# Patient Record
Sex: Female | Born: 1950 | Race: White | Hispanic: No | Smoking: Current some day smoker
Health system: Southern US, Community
[De-identification: ages and names within clinical notes are randomized; demographics above are authoritative.]

## PROBLEM LIST (undated history)

## (undated) DIAGNOSIS — I10 Essential (primary) hypertension: Secondary | ICD-10-CM

## (undated) DIAGNOSIS — M329 Systemic lupus erythematosus, unspecified: Secondary | ICD-10-CM

## (undated) DIAGNOSIS — H269 Unspecified cataract: Secondary | ICD-10-CM

## (undated) DIAGNOSIS — M1990 Unspecified osteoarthritis, unspecified site: Secondary | ICD-10-CM

## (undated) DIAGNOSIS — R51 Headache: Secondary | ICD-10-CM

## (undated) DIAGNOSIS — Z87440 Personal history of urinary (tract) infections: Secondary | ICD-10-CM

## (undated) DIAGNOSIS — K219 Gastro-esophageal reflux disease without esophagitis: Secondary | ICD-10-CM

## (undated) DIAGNOSIS — G4730 Sleep apnea, unspecified: Secondary | ICD-10-CM

## (undated) DIAGNOSIS — R21 Rash and other nonspecific skin eruption: Secondary | ICD-10-CM

## (undated) DIAGNOSIS — R011 Cardiac murmur, unspecified: Secondary | ICD-10-CM

## (undated) DIAGNOSIS — Z950 Presence of cardiac pacemaker: Secondary | ICD-10-CM

## (undated) DIAGNOSIS — R202 Paresthesia of skin: Secondary | ICD-10-CM

## (undated) DIAGNOSIS — R200 Anesthesia of skin: Secondary | ICD-10-CM

## (undated) DIAGNOSIS — E785 Hyperlipidemia, unspecified: Secondary | ICD-10-CM

## (undated) DIAGNOSIS — I495 Sick sinus syndrome: Secondary | ICD-10-CM

## (undated) HISTORY — PX: KNEE ARTHROPLASTY: SHX992

## (undated) HISTORY — PX: ANTERIOR FUSION CERVICAL SPINE: SUR626

## (undated) HISTORY — PX: POSTERIOR FUSION CERVICAL SPINE: SUR628

## (undated) HISTORY — PX: CARDIAC CATHETERIZATION: SHX172

## (undated) HISTORY — PX: ABDOMINAL HYSTERECTOMY: SHX81

## (undated) HISTORY — PX: KIDNEY SURGERY: SHX687

## (undated) HISTORY — PX: CHOLECYSTECTOMY: SHX55

---

## 2008-02-29 ENCOUNTER — Emergency Department: Payer: Self-pay

## 2009-10-07 ENCOUNTER — Ambulatory Visit: Payer: Self-pay | Admitting: Internal Medicine

## 2011-12-13 ENCOUNTER — Other Ambulatory Visit: Payer: Self-pay | Admitting: Neurosurgery

## 2011-12-13 ENCOUNTER — Ambulatory Visit
Admission: RE | Admit: 2011-12-13 | Discharge: 2011-12-13 | Disposition: A | Discharge status: Home / Self Care | Payer: Medicare Other | Source: Ambulatory Visit | Attending: Neurosurgery | Admitting: Neurosurgery

## 2011-12-13 DIAGNOSIS — M79605 Pain in left leg: Secondary | ICD-10-CM

## 2011-12-23 ENCOUNTER — Other Ambulatory Visit: Payer: Self-pay | Admitting: Neurosurgery

## 2012-01-14 NOTE — Pre-Procedure Instructions (Addendum)
Patricia Christensen  01/14/2012   Your procedure is scheduled on:  Friday, November 8th.  Report to [facility] at 7:30 AM.  Call this number if you have problems the morning of surgery: [phone]   Remember:   Do not eat food or drink any liquid:After Midnight.     Take these medicines the morning of surgery with A SIP OF WATER: Omeprazole, Atenolol, Gabapentin.  May take Oxycodone- Acetaminophen if needed.   Do not wear jewelry, make-up or nail polish.  Do not wear lotions, powders, or perfumes. You may wear deodorant.  Do not shave 48 hours prior to surgery. Men may shave face and neck.  Do not bring valuables to the hospital.  Contacts, dentures or bridgework may not be worn into surgery.  Leave suitcase in the car. After surgery it may be brought to your room.  For patients admitted to the hospital, checkout time is 11:00 AM the day of discharge.   Patients discharged the day of surgery will not be allowed to drive home.  Name and phone number of your driver:  NA  Special Instructions: Shower using CHG 2 nights before surgery and the night before surgery.  If you shower the day of surgery use CHG.  Use special wash - you have one bottle of CHG for all showers.  You should use approximately 1/3 of the bottle for each shower. N/A   Please read over the following fact sheets that you were given: Pain Booklet, Coughing and Deep Breathing and Surgical Site Infection Prevention

## 2012-01-17 ENCOUNTER — Encounter (HOSPITAL_COMMUNITY)
Admission: RE | Admit: 2012-01-17 | Discharge: 2012-01-17 | Disposition: A | Discharge status: Home / Self Care | Payer: Medicare Other | Source: Ambulatory Visit | Attending: Neurosurgery | Admitting: Neurosurgery

## 2012-01-17 ENCOUNTER — Encounter (HOSPITAL_COMMUNITY): Payer: Self-pay

## 2012-01-17 HISTORY — DX: Systemic lupus erythematosus, unspecified: M32.9

## 2012-01-17 HISTORY — DX: Sleep apnea, unspecified: G47.30

## 2012-01-17 HISTORY — DX: Anesthesia of skin: R20.0

## 2012-01-17 HISTORY — DX: Unspecified cataract: H26.9

## 2012-01-17 HISTORY — DX: Headache: R51

## 2012-01-17 HISTORY — DX: Gastro-esophageal reflux disease without esophagitis: K21.9

## 2012-01-17 HISTORY — DX: Personal history of urinary (tract) infections: Z87.440

## 2012-01-17 HISTORY — DX: Anesthesia of skin: R20.2

## 2012-01-17 HISTORY — DX: Sick sinus syndrome: I49.5

## 2012-01-17 HISTORY — DX: Essential (primary) hypertension: I10

## 2012-01-17 HISTORY — DX: Unspecified osteoarthritis, unspecified site: M19.90

## 2012-01-17 HISTORY — DX: Cardiac murmur, unspecified: R01.1

## 2012-01-17 HISTORY — DX: Rash and other nonspecific skin eruption: R21

## 2012-01-17 HISTORY — DX: Presence of cardiac pacemaker: Z95.0

## 2012-01-17 HISTORY — DX: Hyperlipidemia, unspecified: E78.5

## 2012-01-17 LAB — CBC
MCV: 94.2 fL (ref 78.0–100.0)
Platelets: 179 10*3/uL (ref 150–400)
RBC: 5.17 MIL/uL — ABNORMAL HIGH (ref 3.87–5.11)
WBC: 9.8 10*3/uL (ref 4.0–10.5)

## 2012-01-17 LAB — BASIC METABOLIC PANEL
CO2: 24 mEq/L (ref 19–32)
Calcium: 9.4 mg/dL (ref 8.4–10.5)
Chloride: 105 mEq/L (ref 96–112)
GFR calc Af Amer: 72 mL/min — ABNORMAL LOW (ref >90)
Sodium: 139 mEq/L (ref 135–145)

## 2012-01-17 LAB — SURGICAL PCR SCREEN
MRSA, PCR: NEGATIVE
Staphylococcus aureus: NEGATIVE

## 2012-01-17 NOTE — Progress Notes (Signed)
I faxed a request to Dr Christell Constant in [facility], requesting most recent office notes, EKG, Pacemaker Perioperative Orders, Stress Test, and 2 D echo.

## 2012-01-18 NOTE — Consult Note (Addendum)
Anesthesia Chart Review:  Patient is a 62 year old female scheduled for left L3-4 laminectomy and diskectomy by Dr. Franky Macho on 01/21/12.  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    Cardiologist is Dr. Gerrianne Scale at [facility].  Last visit was 10/21/11 with one year follow-up recommended.  He did order a stress and echo to be done next year prior to her next appointment (I spoke with Dr. Kathi Der nurse [place] who reported these were ordered as routine testing.)  Alena also reported that the last carotid duplex available at their office was from 2008 and showed no significant ICA stenosis.  Last pacemaker evaluation was 12/15/11.  (Cath, echo, and stress reports are listed in his 10/21/11 note.)    EKG report on 02/09/11 showed atrial paced rhythm, consider LA abnormality.  2-D echocardiogram on 09/25/08 showed normal left ventricular size and function, mild left atrial enlargement, upper normal pulmonary pressures.  Cardiac cath on 10/31/08 showed the left ventricular systolic function is excellent with an LVEF of 65% and no focal wall motion abnormality. The LVEDP is 20. The left main is free of disease. The LAD is large in the proximal portion and tapers to more moderate size at the apex. A moderate-sized D1 is present, at the origin is a 25% abnormality. D2 is small without disease. The circumflex artery is dominant. A moderate-sized OM1 and larger OM 3 and PDA are present without disease. The right coronary artery is a rather small nondominant vessel. There is a 50% lesion at the ostium and catheter-induced back spasm of that site is noted.  Nuclear stress test on 06/09/2006 was normal without evidence of ischemia.  CXR on 01/17/12 showed heart size and vascular pattern are normal. Lungs are clear. Two lead pacer identified with  generator over the right hemithorax.  CBC and BMET noted.  She has a history of a PPM, but non-obstructive CAD.  She saw her Cardiologist within the past three months and by notes appeared stable.  I reviewed above with Anesthesiologist Dr. Katrinka Blazing.  If no significant change in her status then anticipate she can proceed as planned.   Shonna Chock, PA-C 01/19/12 1558

## 2012-01-20 MED ORDER — CEFAZOLIN SODIUM-DEXTROSE 2-3 GM-% IV SOLR
2.0000 g | INTRAVENOUS | Status: AC
  Administered 2012-01-21: 2 g via INTRAVENOUS
  Filled 2012-01-20: qty 50

## 2012-01-20 NOTE — Progress Notes (Signed)
SPOKE WITH TAMARA AT DR MOORE'S OFFICE, REFAXED PPM ORDER FORM TO DR MOORE'S OFFICE TO BE FILLED OUT AND SIGNED.

## 2012-01-21 ENCOUNTER — Encounter (HOSPITAL_COMMUNITY)
Admission: RE | Disposition: A | Discharge status: Home / Self Care | Payer: Self-pay | Source: Ambulatory Visit | Attending: Neurosurgery

## 2012-01-21 ENCOUNTER — Ambulatory Visit (HOSPITAL_COMMUNITY): Payer: Medicare Other

## 2012-01-21 ENCOUNTER — Encounter (HOSPITAL_COMMUNITY): Payer: Self-pay | Admitting: *Deleted

## 2012-01-21 ENCOUNTER — Inpatient Hospital Stay (HOSPITAL_COMMUNITY)
Admission: RE | Admit: 2012-01-21 | Discharge: 2012-01-21 | DRG: 491 | Disposition: A | Discharge status: Home / Self Care | Payer: Medicare Other | Source: Ambulatory Visit | Attending: Neurosurgery | Admitting: Neurosurgery

## 2012-01-21 ENCOUNTER — Ambulatory Visit (HOSPITAL_COMMUNITY): Payer: Medicare Other | Admitting: Vascular Surgery

## 2012-01-21 ENCOUNTER — Encounter (HOSPITAL_COMMUNITY): Payer: Self-pay | Admitting: Vascular Surgery

## 2012-01-21 DIAGNOSIS — Z6836 Body mass index (BMI) 36.0-36.9, adult: Secondary | ICD-10-CM

## 2012-01-21 DIAGNOSIS — Z91041 Radiographic dye allergy status: Secondary | ICD-10-CM

## 2012-01-21 DIAGNOSIS — Z888 Allergy status to other drugs, medicaments and biological substances status: Secondary | ICD-10-CM

## 2012-01-21 DIAGNOSIS — F17200 Nicotine dependence, unspecified, uncomplicated: Secondary | ICD-10-CM | POA: Diagnosis present

## 2012-01-21 DIAGNOSIS — K219 Gastro-esophageal reflux disease without esophagitis: Secondary | ICD-10-CM | POA: Diagnosis present

## 2012-01-21 DIAGNOSIS — R4182 Altered mental status, unspecified: Secondary | ICD-10-CM | POA: Diagnosis present

## 2012-01-21 DIAGNOSIS — Z79899 Other long term (current) drug therapy: Secondary | ICD-10-CM

## 2012-01-21 DIAGNOSIS — Z96659 Presence of unspecified artificial knee joint: Secondary | ICD-10-CM

## 2012-01-21 DIAGNOSIS — M1990 Unspecified osteoarthritis, unspecified site: Secondary | ICD-10-CM | POA: Diagnosis present

## 2012-01-21 DIAGNOSIS — M5126 Other intervertebral disc displacement, lumbar region: Principal | ICD-10-CM | POA: Diagnosis present

## 2012-01-21 DIAGNOSIS — Z886 Allergy status to analgesic agent status: Secondary | ICD-10-CM

## 2012-01-21 DIAGNOSIS — I10 Essential (primary) hypertension: Secondary | ICD-10-CM | POA: Diagnosis present

## 2012-01-21 DIAGNOSIS — M329 Systemic lupus erythematosus, unspecified: Secondary | ICD-10-CM | POA: Diagnosis present

## 2012-01-21 DIAGNOSIS — Z950 Presence of cardiac pacemaker: Secondary | ICD-10-CM

## 2012-01-21 DIAGNOSIS — Z885 Allergy status to narcotic agent status: Secondary | ICD-10-CM

## 2012-01-21 HISTORY — PX: LUMBAR LAMINECTOMY/DECOMPRESSION MICRODISCECTOMY: SHX5026

## 2012-01-21 SURGERY — LUMBAR LAMINECTOMY/DECOMPRESSION MICRODISCECTOMY 1 LEVEL
Anesthesia: General | Site: Spine Lumbar | Laterality: Left | Wound class: Clean

## 2012-01-21 MED ORDER — GABAPENTIN 300 MG PO CAPS
300.0000 mg | ORAL_CAPSULE | Freq: Three times a day (TID) | ORAL | Status: DC
  Filled 2012-01-21 (×2): qty 1

## 2012-01-21 MED ORDER — ATENOLOL 50 MG PO TABS
50.0000 mg | ORAL_TABLET | Freq: Every day | ORAL | Status: DC
  Filled 2012-01-21: qty 1

## 2012-01-21 MED ORDER — SODIUM CHLORIDE 0.9 % IJ SOLN: 3.0000 mL | INTRAMUSCULAR | Status: DC | PRN

## 2012-01-21 MED ORDER — MIDAZOLAM HCL 5 MG/5ML IJ SOLN
INTRAMUSCULAR | Status: DC | PRN
  Administered 2012-01-21: 2 mg via INTRAVENOUS

## 2012-01-21 MED ORDER — MORPHINE SULFATE 2 MG/ML IJ SOLN
1.0000 mg | INTRAMUSCULAR | Status: DC | PRN
  Administered 2012-01-21: 2 mg via INTRAVENOUS
  Filled 2012-01-21: qty 1

## 2012-01-21 MED ORDER — ROCURONIUM BROMIDE 100 MG/10ML IV SOLN
INTRAVENOUS | Status: DC | PRN
  Administered 2012-01-21: 10 mg via INTRAVENOUS
  Administered 2012-01-21: 50 mg via INTRAVENOUS

## 2012-01-21 MED ORDER — ACETAMINOPHEN 10 MG/ML IV SOLN
1000.0000 mg | Freq: Four times a day (QID) | INTRAVENOUS | Status: DC
  Administered 2012-01-21: 1000 mg via INTRAVENOUS
  Filled 2012-01-21 (×3): qty 100

## 2012-01-21 MED ORDER — MORPHINE SULFATE 4 MG/ML IJ SOLN
INTRAMUSCULAR | Status: AC
  Administered 2012-01-21: 2 mg
  Filled 2012-01-21: qty 1

## 2012-01-21 MED ORDER — POTASSIUM CHLORIDE IN NACL 20-0.9 MEQ/L-% IV SOLN
INTRAVENOUS | Status: DC
  Filled 2012-01-21 (×3): qty 1000

## 2012-01-21 MED ORDER — FENTANYL CITRATE 0.05 MG/ML IJ SOLN
INTRAMUSCULAR | Status: AC
  Filled 2012-01-21: qty 2

## 2012-01-21 MED ORDER — ACETAMINOPHEN 325 MG PO TABS: 650.0000 mg | ORAL_TABLET | ORAL | Status: DC | PRN

## 2012-01-21 MED ORDER — MORPHINE SULFATE 2 MG/ML IJ SOLN
1.0000 mg | INTRAMUSCULAR | Status: DC | PRN
  Administered 2012-01-21: 2 mg via INTRAVENOUS

## 2012-01-21 MED ORDER — NITROGLYCERIN 0.4 MG SL SUBL
0.4000 mg | SUBLINGUAL_TABLET | SUBLINGUAL | Status: DC | PRN
Start: 2012-01-21 — End: 2012-01-21

## 2012-01-21 MED ORDER — GLYCOPYRROLATE 0.2 MG/ML IJ SOLN
INTRAMUSCULAR | Status: DC | PRN
  Administered 2012-01-21: 0.4 mg via INTRAVENOUS

## 2012-01-21 MED ORDER — ONDANSETRON HCL 4 MG/2ML IJ SOLN
INTRAMUSCULAR | Status: DC | PRN
  Administered 2012-01-21: 4 mg via INTRAVENOUS

## 2012-01-21 MED ORDER — NEOSTIGMINE METHYLSULFATE 1 MG/ML IJ SOLN
INTRAMUSCULAR | Status: DC | PRN
  Administered 2012-01-21: 3 mg via INTRAVENOUS

## 2012-01-21 MED ORDER — ARTIFICIAL TEARS OP OINT
TOPICAL_OINTMENT | OPHTHALMIC | Status: DC | PRN
  Administered 2012-01-21: 1 via OPHTHALMIC

## 2012-01-21 MED ORDER — LIDOCAINE-EPINEPHRINE 0.5 %-1:200000 IJ SOLN
INTRAMUSCULAR | Status: DC | PRN
  Administered 2012-01-21: 10 mL

## 2012-01-21 MED ORDER — HYDROCODONE-ACETAMINOPHEN 5-325 MG PO TABS: 1.0000 | ORAL_TABLET | ORAL | Status: DC | PRN

## 2012-01-21 MED ORDER — CYCLOBENZAPRINE HCL 10 MG PO TABS: 10.0000 mg | ORAL_TABLET | Freq: Three times a day (TID) | ORAL | Status: DC | PRN

## 2012-01-21 MED ORDER — METHYLPREDNISOLONE ACETATE 80 MG/ML IJ SUSP: INTRAMUSCULAR | Status: DC | PRN

## 2012-01-21 MED ORDER — 0.9 % SODIUM CHLORIDE (POUR BTL) OPTIME
TOPICAL | Status: DC | PRN
  Administered 2012-01-21: 1000 mL

## 2012-01-21 MED ORDER — OXYCODONE HCL 5 MG PO TABS: 5.0000 mg | ORAL_TABLET | Freq: Once | ORAL | Status: DC | PRN

## 2012-01-21 MED ORDER — THROMBIN 5000 UNITS EX KIT
PACK | CUTANEOUS | Status: DC | PRN
  Administered 2012-01-21 (×2): 5000 [IU] via TOPICAL

## 2012-01-21 MED ORDER — ONDANSETRON HCL 4 MG/2ML IJ SOLN: 4.0000 mg | Freq: Four times a day (QID) | INTRAMUSCULAR | Status: DC | PRN

## 2012-01-21 MED ORDER — PANTOPRAZOLE SODIUM 40 MG PO TBEC: 40.0000 mg | DELAYED_RELEASE_TABLET | Freq: Every day | ORAL | Status: DC

## 2012-01-21 MED ORDER — SODIUM CHLORIDE 0.9 % IJ SOLN
3.0000 mL | Freq: Two times a day (BID) | INTRAMUSCULAR | Status: DC
  Administered 2012-01-21: 3 mL via INTRAVENOUS

## 2012-01-21 MED ORDER — PROPOFOL 10 MG/ML IV BOLUS
INTRAVENOUS | Status: DC | PRN
  Administered 2012-01-21: 150 mg via INTRAVENOUS

## 2012-01-21 MED ORDER — LACTATED RINGERS IV SOLN
INTRAVENOUS | Status: DC | PRN
  Administered 2012-01-21 (×2): via INTRAVENOUS

## 2012-01-21 MED ORDER — PHENOL 1.4 % MT LIQD: 1.0000 | OROMUCOSAL | Status: DC | PRN

## 2012-01-21 MED ORDER — ONDANSETRON HCL 4 MG/2ML IJ SOLN: 4.0000 mg | INTRAMUSCULAR | Status: DC | PRN

## 2012-01-21 MED ORDER — LIDOCAINE HCL (CARDIAC) 20 MG/ML IV SOLN
INTRAVENOUS | Status: DC | PRN
  Administered 2012-01-21: 20 mg via INTRAVENOUS

## 2012-01-21 MED ORDER — FENTANYL CITRATE 0.05 MG/ML IJ SOLN: INTRAMUSCULAR | Status: DC | PRN

## 2012-01-21 MED ORDER — OXYCODONE-ACETAMINOPHEN 5-325 MG PO TABS: 1.0000 | ORAL_TABLET | ORAL | Status: DC | PRN

## 2012-01-21 MED ORDER — HEMOSTATIC AGENTS (NO CHARGE) OPTIME
TOPICAL | Status: DC | PRN
  Administered 2012-01-21: 1 via TOPICAL

## 2012-01-21 MED ORDER — OXYCODONE HCL 5 MG/5ML PO SOLN: 5.0000 mg | Freq: Once | ORAL | Status: DC | PRN

## 2012-01-21 MED ORDER — LISINOPRIL 40 MG PO TABS
40.0000 mg | ORAL_TABLET | Freq: Every day | ORAL | Status: DC
  Filled 2012-01-21: qty 1

## 2012-01-21 MED ORDER — HYDROXYCHLOROQUINE SULFATE 200 MG PO TABS
200.0000 mg | ORAL_TABLET | Freq: Every day | ORAL | Status: DC
  Filled 2012-01-21: qty 1

## 2012-01-21 MED ORDER — MENTHOL 3 MG MT LOZG: 1.0000 | LOZENGE | OROMUCOSAL | Status: DC | PRN

## 2012-01-21 MED ORDER — FENTANYL CITRATE 0.05 MG/ML IJ SOLN
INTRAMUSCULAR | Status: DC | PRN
  Administered 2012-01-21: 100 ug via INTRAVENOUS
  Administered 2012-01-21 (×2): 50 ug via INTRAVENOUS

## 2012-01-21 MED ORDER — ATORVASTATIN CALCIUM 10 MG PO TABS
10.0000 mg | ORAL_TABLET | Freq: Every day | ORAL | Status: DC
  Filled 2012-01-21: qty 1

## 2012-01-21 MED ORDER — ESTROGENS CONJUGATED 0.3 MG PO TABS
0.3000 mg | ORAL_TABLET | Freq: Every day | ORAL | Status: DC
  Filled 2012-01-21: qty 1

## 2012-01-21 MED ORDER — ACETAMINOPHEN 650 MG RE SUPP: 650.0000 mg | RECTAL | Status: DC | PRN

## 2012-01-21 SURGICAL SUPPLY — 55 items
BAG DECANTER FOR FLEXI CONT (MISCELLANEOUS) IMPLANT
BENZOIN TINCTURE PRP APPL 2/3 (GAUZE/BANDAGES/DRESSINGS) IMPLANT
BLADE SURG 11 STRL SS (BLADE) ×2 IMPLANT
BLADE SURG ROTATE 9660 (MISCELLANEOUS) IMPLANT
BUR MATCHSTICK NEURO 3.0 LAGG (BURR) ×2 IMPLANT
CANISTER SUCTION 2500CC (MISCELLANEOUS) ×2 IMPLANT
CLOTH BEACON ORANGE TIMEOUT ST (SAFETY) ×2 IMPLANT
CONT SPEC 4OZ CLIKSEAL STRL BL (MISCELLANEOUS) ×2 IMPLANT
DECANTER SPIKE VIAL GLASS SM (MISCELLANEOUS) ×2 IMPLANT
DERMABOND ADVANCED (GAUZE/BANDAGES/DRESSINGS) ×1
DERMABOND ADVANCED .7 DNX12 (GAUZE/BANDAGES/DRESSINGS) ×1 IMPLANT
DRAPE LAPAROTOMY 100X72X124 (DRAPES) ×2 IMPLANT
DRAPE MICROSCOPE LEICA (MISCELLANEOUS) ×2 IMPLANT
DRAPE POUCH INSTRU U-SHP 10X18 (DRAPES) ×2 IMPLANT
DRAPE SURG 17X23 STRL (DRAPES) ×2 IMPLANT
DURAPREP 26ML APPLICATOR (WOUND CARE) ×2 IMPLANT
ELECT REM PT RETURN 9FT ADLT (ELECTROSURGICAL) ×2
ELECTRODE REM PT RTRN 9FT ADLT (ELECTROSURGICAL) ×1 IMPLANT
GAUZE SPONGE 4X4 16PLY XRAY LF (GAUZE/BANDAGES/DRESSINGS) IMPLANT
GLOVE BIO SURGEON STRL SZ 6.5 (GLOVE) ×8 IMPLANT
GLOVE BIOGEL PI IND STRL 6.5 (GLOVE) ×1 IMPLANT
GLOVE BIOGEL PI IND STRL 7.5 (GLOVE) ×1 IMPLANT
GLOVE BIOGEL PI INDICATOR 6.5 (GLOVE) ×1
GLOVE BIOGEL PI INDICATOR 7.5 (GLOVE) ×1
GLOVE ECLIPSE 6.5 STRL STRAW (GLOVE) ×4 IMPLANT
GLOVE ECLIPSE 7.5 STRL STRAW (GLOVE) ×4 IMPLANT
GLOVE EXAM NITRILE LRG STRL (GLOVE) IMPLANT
GLOVE EXAM NITRILE MD LF STRL (GLOVE) IMPLANT
GLOVE EXAM NITRILE XL STR (GLOVE) IMPLANT
GLOVE EXAM NITRILE XS STR PU (GLOVE) IMPLANT
GOWN BRE IMP SLV AUR LG STRL (GOWN DISPOSABLE) ×6 IMPLANT
GOWN BRE IMP SLV AUR XL STRL (GOWN DISPOSABLE) ×2 IMPLANT
GOWN STRL REIN 2XL LVL4 (GOWN DISPOSABLE) IMPLANT
KIT BASIN OR (CUSTOM PROCEDURE TRAY) ×2 IMPLANT
KIT ROOM TURNOVER OR (KITS) ×2 IMPLANT
NEEDLE BLUNT 18X1 FOR OR ONLY (NEEDLE) ×2 IMPLANT
NEEDLE HYPO 25X1 1.5 SAFETY (NEEDLE) ×2 IMPLANT
NEEDLE SPNL 18GX3.5 QUINCKE PK (NEEDLE) IMPLANT
NS IRRIG 1000ML POUR BTL (IV SOLUTION) ×2 IMPLANT
PACK LAMINECTOMY NEURO (CUSTOM PROCEDURE TRAY) ×2 IMPLANT
PAD ARMBOARD 7.5X6 YLW CONV (MISCELLANEOUS) ×6 IMPLANT
RUBBERBAND STERILE (MISCELLANEOUS) ×4 IMPLANT
SPONGE GAUZE 4X4 12PLY (GAUZE/BANDAGES/DRESSINGS) IMPLANT
SPONGE LAP 4X18 X RAY DECT (DISPOSABLE) IMPLANT
SPONGE SURGIFOAM ABS GEL SZ50 (HEMOSTASIS) ×2 IMPLANT
STRIP CLOSURE SKIN 1/2X4 (GAUZE/BANDAGES/DRESSINGS) IMPLANT
SUT VIC AB 0 CT1 18XCR BRD8 (SUTURE) ×1 IMPLANT
SUT VIC AB 0 CT1 8-18 (SUTURE) ×1
SUT VIC AB 2-0 CT1 18 (SUTURE) ×2 IMPLANT
SUT VIC AB 3-0 SH 8-18 (SUTURE) ×2 IMPLANT
SYR 20ML ECCENTRIC (SYRINGE) ×2 IMPLANT
SYR 3ML LL SCALE MARK (SYRINGE) ×2 IMPLANT
TOWEL OR 17X24 6PK STRL BLUE (TOWEL DISPOSABLE) ×2 IMPLANT
TOWEL OR 17X26 10 PK STRL BLUE (TOWEL DISPOSABLE) ×2 IMPLANT
WATER STERILE IRR 1000ML POUR (IV SOLUTION) ×2 IMPLANT

## 2012-01-21 NOTE — Op Note (Signed)
01/21/2012  8:50 PM  PATIENT:  Patricia Christensen  61 y.o. female  PRE-OPERATIVE DIAGNOSIS:  lumbar herniated disc lumbar radiculopathy low back pain Far lateral Left L3/4  POST-OPERATIVE DIAGNOSIS:  lumbar herniated disc lumbar radiculopathy low back pain Far lateral left L3/4  PROCEDURE:  Procedure(s):Far lateral Left L3/4 LUMBAR LAMINECTOMY/DECOMPRESSION MICRODISsection 1 LEVEL  SURGEON:  Surgeon(s): Carmela Hurt, MD Reinaldo Meeker, MD  ASSISTANTS:Randy Kritzer  ANESTHESIA:   general  EBL:     BLOOD ADMINISTERED:none  CELL SAVER GIVEN:none   COUNT:per nursing  DRAINS: none   SPECIMEN:  No Specimen  DICTATION: Mrs. Bruso was brought to the operating room, intubated, and placed under a general anesthetic. She was positioned prone onto a wilson frame. All pressure points were properly padded. Her back was prepped and draped in a sterile fashion. I infiltrated 10cc 1/2%lidocaine/1:200000 strength epinephrine into the lumbar region. I opened the skin with a 10 blade and dissected down to the spinous process with monopolar cautery. I exposed the lamina of L2 and L3 to expose the pars of L3 on the left. I placed a dissector inferior to the lamina of L2 as shown on xray and I then exposed the pars caudal to that lamina which was L3.  I used the drill to remove the lateral portion of the pars, then the Kerrison punch to expose the ligamentum flavum. I removed the ligamentum flavum and the L3 root was within the field of view. I brought the microscope into the operative field to aid in microdissection.  With Dr. Trudee Grip assistance we opened the disc space and removed a great deal of degenerated disc both medially and laterally. I went further laterally as that was the location on the preop ct of the largest portion of the disc. I removed that without difficulty using curettes, rongeurs, and Kerrison punches. After full decompression in the far lateral space of the L3 root I irrigated and  achieved hemostasis. I placed fentanyl and depomedrol on the nerve root. I then closed the wound in layers approximating the thoracolumbar fascia, subcutaneous and subcuticular tissue. I used dermabond for a sterile dressing.   PLAN OF CARE: Admit to inpatient   PATIENT DISPOSITION:  PACU - hemodynamically stable.   Delay start of Pharmacological VTE agent (>24hrs) due to surgical blood loss or risk of bleeding:  yes

## 2012-01-21 NOTE — Discharge Summary (Signed)
Physician Discharge Summary  Patient ID: Patricia Christensen MRN: [id number] DOB/AGE: 12/08/1950 61 y.o.  Admit date: 01/21/2012 Discharge date: 01/21/2012  Admission Diagnoses:Left L3/4 far lateral disc herniation  Discharge Diagnoses:Left L3/4 far lateral disc herniation  Active Problems:  * No active hospital problems. *    Discharged Condition: good  Hospital Course: Patricia Christensen was taken to the operating room earlier today where she underwent a far lateral approach for a discetomy on the left at L3/4. Post op she has less pain. Her wound is clean dry and without signs of infection. She has voided, walked, and tolerated a diet at discharge.   Consults: None  Significant Diagnostic Studies: none  Treatments: surgery: Left far lateral L L3/4 discetomy with microdissection  Discharge Exam: Blood pressure 140/79, pulse 86, temperature 98.6 F (37 C), temperature source Oral, resp. rate 20, SpO2 94.00%. General appearance: alert, cooperative, appears stated age and no distress Neurologic: Alert and oriented X 3, normal strength and tone. Normal symmetric reflexes. Normal coordination and gait  Disposition: Final discharge disposition not confirmed     Medication List     As of 01/21/2012  8:39 PM    TAKE these medications         atenolol 50 MG tablet   Commonly known as: TENORMIN   Take 50 mg by mouth daily.      atorvastatin 10 MG tablet   Commonly known as: LIPITOR   Take 10 mg by mouth daily.      estrogens (conjugated) 0.3 MG tablet   Commonly known as: PREMARIN   Take 0.3 mg by mouth daily. Take daily for 21 days then do not take for 7 days.      gabapentin 300 MG capsule   Commonly known as: NEURONTIN   Take 300 mg by mouth 3 (three) times daily.      hydroxychloroquine 200 MG tablet   Commonly known as: PLAQUENIL   Take 200 mg by mouth daily.      lisinopril 40 MG tablet   Commonly known as: PRINIVIL,ZESTRIL   Take 40 mg by mouth daily.      nitroGLYCERIN  0.4 MG SL tablet   Commonly known as: NITROSTAT   Place 0.4 mg under the tongue every 5 (five) minutes as needed. For chest pain      omeprazole 20 MG capsule   Commonly known as: PRILOSEC   Take 20 mg by mouth daily.      oxyCODONE-acetaminophen 5-500 MG per capsule   Commonly known as: TYLOX   Take 1 capsule by mouth every 6 (six) hours as needed. For pain           Follow-up Information    Follow up with Lavella Myren L, MD. In 4 weeks. (call to make appt )    Contact information:   [address]                         [address] [place] [address] [phone]          Signed: Molly Savarino L 01/21/2012, 8:39 PM

## 2012-01-21 NOTE — Plan of Care (Signed)
Problem: Consults Goal: Diagnosis - Spinal Surgery Outcome: Completed/Met Date Met:  01/21/12 Lumbar Laminectomy (Complex)

## 2012-01-21 NOTE — Anesthesia Preprocedure Evaluation (Addendum)
Anesthesia Evaluation  Patient identified by MRN, date of birth, ID band Patient awake    Reviewed: Allergy & Precautions, H&P , NPO status , Patient's Chart, lab work & pertinent test results  Airway Mallampati: II  Neck ROM: full    Dental  (+) Dental Advisory Given   Pulmonary sleep apnea ,          Cardiovascular hypertension, Pt. on home beta blockers + dysrhythmias (sick sinus syndrome) + pacemaker     Neuro/Psych    GI/Hepatic GERD-  ,  Endo/Other  Morbid obesityobese  Renal/GU      Musculoskeletal   Abdominal   Peds  Hematology   Anesthesia Other Findings   Reproductive/Obstetrics                          Anesthesia Physical Anesthesia Plan  ASA: III  Anesthesia Plan: General   Post-op Pain Management:    Induction: Intravenous  Airway Management Planned: Oral ETT  Additional Equipment:   Intra-op Plan:   Post-operative Plan: Extubation in OR  Informed Consent: I have reviewed the patients History and Physical, chart, labs and discussed the procedure including the risks, benefits and alternatives for the proposed anesthesia with the patient or authorized representative who has indicated his/her understanding and acceptance.   Dental advisory given  Plan Discussed with: Anesthesiologist and Surgeon  Anesthesia Plan Comments:         Anesthesia Quick Evaluation

## 2012-01-21 NOTE — Preoperative (Signed)
Beta Blockers   Reason not to administer Beta Blockers:Not Applicable, pt took atenolol 11/8

## 2012-01-21 NOTE — Anesthesia Postprocedure Evaluation (Signed)
Anesthesia Post Note  Patient: Patricia Christensen  Procedure(s) Performed: Procedure(s) (LRB): LUMBAR LAMINECTOMY/DECOMPRESSION MICRODISCECTOMY 1 LEVEL (Left)  Anesthesia type: General  Patient location: PACU  Post pain: Pain level controlled and Adequate analgesia  Post assessment: Post-op Vital signs reviewed, Patient's Cardiovascular Status Stable, Respiratory Function Stable, Patent Airway and Pain level controlled  Last Vitals:  Filed Vitals:   01/21/12 1300  BP: 124/57  Pulse: 75  Temp: 36.1 C  Resp: 17    Post vital signs: Reviewed and stable  Level of consciousness: awake, alert  and oriented  Complications: No apparent anesthesia complications

## 2012-01-21 NOTE — Transfer of Care (Signed)
Immediate Anesthesia Transfer of Care Note  Patient: Patricia Christensen  Procedure(s) Performed: Procedure(s) (LRB) with comments: LUMBAR LAMINECTOMY/DECOMPRESSION MICRODISCECTOMY 1 LEVEL (Left) - Left lumbar three-four laminectomy and diskectomy  Patient Location: PACU  Anesthesia Type:General  Level of Consciousness: awake, alert  and oriented  Airway & Oxygen Therapy: Patient Spontanous Breathing and Patient connected to face mask oxygen  Post-op Assessment: Report given to PACU RN, Post -op Vital signs reviewed and stable and Patient moving all extremities X 4  Post vital signs: Reviewed and stable  Complications: No apparent anesthesia complications

## 2012-01-21 NOTE — H&P (Signed)
  BP 142/84  Pulse 67  Temp 98 F (36.7 C) (Oral)  Resp 20  SpO2 98% Patricia Christensen comes in today for evaluation of pain that she has in her back and left lower extremity which she has had at least for the last 18 months.  She states that she has had this pain probably longer than that.  At age 61 she is right-handed and disabled.    PAST MEDICAL HISTORY:  Significant for hypertension.  She has degenerative joint disease, lupus erythematosus.  She has had knee replacements on both the right and left sides.  She has had some urinary incontinence in the past.    PAST SURGICAL HISTORY:  She has undergone four knee operations, two neck operations and has had the pacemaker placed four times.  She has undergone hepatic surgery, right kidney surgery.  She has also undergone a cholecystectomy.    FAMILY HISTORY:    Mother 80, father 51, both deceased.  Cancer, heart disease and diabetes present in the family history.    DRUG ALLERGIES:   SHE SAYS THAT SHE IS ALLERGIC TO PREDNISONE, ASPIRIN, DILAUDID AND IV DYE.  SHE ALSO HAS ALLERGIES TO LODINE AND CODEINE.    SOCIAL HISTORY:    She does smoke.  She doesn't use alcohol.  She doesn't use illicit drugs.  She is 5', 4" tall.  She does weigh 210 lbs.  She has a pulse of 66.    REVIEW OF SYSTEMS:   Positive for eyeglasses, nasal congestion, back pain, joint pain, arthritis.  She denies constitutional, cardiovascular, respiratory, gastrointestinal, genitourinary, skin, neurological, psychiatric, endocrine, hematologic and allergic problems.    MEDICATIONS:    Medications are Atenolol, Lipitor, Premarin, Plaquenil, Nitrostat, Prilosec, Percocet, Gabapentin.    EXAMINATION:    On examination she is alert, oriented x 4 and answering all questions appropriately.  Memory, language, attention span and fund of knowledge are normal.  Well kempt and in minor distress.  She could toe walk though with difficulty, but    heel walk easier.  Romberg is negative.  Muscle  tone, bulk and coordination are normal.  Pupils are equal, round and reactive to light.  Full extraocular movements.  Full visual fields.  Hearing intact to finger rub bilaterally.  Uvula elevates in the midline.  Shoulder shrug is normal.  Tongue protrudes in the midline.  No cervical masses or bruits.  Lung fields clear.  Heart regular rhythm and rate.  No murmurs or rubs.  Reflexes are not elicited at the knees or ankles.  Downgoing toes to plantar stimulation.  Intact proprioception.  No Hoffmann's sign.  No clonus.  She has a well healed scar in the cervical spine anteriorly and posteriorly.    SUMMARY:      I called Mrs. Deeb with the results of the CT of the lumbar spine.  What it shows looks like a fairly large disc herniation on the left side at L3-4.  When I informed Mrs. Dizdarevic of this, she said she needed to pray and get an answer from God before she could proceed. I told her to take her time.   She has agreed to a lumbar disc resection for decompression of the nerve root. Risks and benefits were explained and she understands that she may not improve, may have a recurrence, bleeding and infection, nerve damage, bowel and or bladder dysfunction.

## 2012-01-21 NOTE — Anesthesia Procedure Notes (Signed)
Procedure Name: Intubation Date/Time: 01/21/2012 10:25 AM Performed by: Elon Alas Pre-anesthesia Checklist: Patient identified, Timeout performed, Emergency Drugs available, Suction available and Patient being monitored Patient Re-evaluated:Patient Re-evaluated prior to inductionOxygen Delivery Method: Circle system utilized Preoxygenation: Pre-oxygenation with 100% oxygen Intubation Type: IV induction Ventilation: Mask ventilation without difficulty Laryngoscope Size: Mac and 3 Grade View: Grade I Tube type: Oral Tube size: 7.5 mm Number of attempts: 1 Airway Equipment and Method: Stylet Placement Confirmation: ETT inserted through vocal cords under direct vision,  breath sounds checked- equal and bilateral and positive ETCO2 Secured at: 22 cm Tube secured with: Tape Dental Injury: Teeth and Oropharynx as per pre-operative assessment

## 2012-01-21 NOTE — Progress Notes (Signed)
PT. UP AD LIB IN HALL WITH STEADY GAIT, TOLERATING DIET.  V/S AT 2040 B/P 141/79, P=80, R=18, TEMP=98.5, SAT 93% ON ROOM AIR.  PT. VERBALIZED UNDERSTANDING OF DISCHARGE ORDERS. PT. NOW DISCHARGED TO CAR VIA W/C WITH STAFF AND FRIEND ASSISTING PT.  CHRIS Jeannifer Drakeford RN

## 2012-01-24 ENCOUNTER — Encounter (HOSPITAL_COMMUNITY): Payer: Self-pay | Admitting: Neurosurgery

## 2015-07-31 ENCOUNTER — Other Ambulatory Visit
Admission: RE | Admit: 2015-07-31 | Discharge: 2015-07-31 | Disposition: A | Discharge status: Home / Self Care | Payer: Medicare Other | Source: Other Acute Inpatient Hospital | Attending: *Deleted | Admitting: *Deleted

## 2015-07-31 DIAGNOSIS — R197 Diarrhea, unspecified: Secondary | ICD-10-CM | POA: Insufficient documentation

## 2015-07-31 LAB — GASTROINTESTINAL PANEL BY PCR, STOOL (REPLACES STOOL CULTURE)
ASTROVIRUS: NOT DETECTED
Adenovirus F40/41: NOT DETECTED
Campylobacter species: NOT DETECTED
Cryptosporidium: NOT DETECTED
Cyclospora cayetanensis: NOT DETECTED
E. COLI O157: NOT DETECTED
ENTEROTOXIGENIC E COLI (ETEC): NOT DETECTED
Entamoeba histolytica: NOT DETECTED
Enteroaggregative E coli (EAEC): NOT DETECTED
Enteropathogenic E coli (EPEC): NOT DETECTED
Giardia lamblia: NOT DETECTED
NOROVIRUS GI/GII: NOT DETECTED
Plesimonas shigelloides: NOT DETECTED
ROTAVIRUS A: NOT DETECTED
SAPOVIRUS (I, II, IV, AND V): NOT DETECTED
SHIGA LIKE TOXIN PRODUCING E COLI (STEC): NOT DETECTED
Salmonella species: NOT DETECTED
Shigella/Enteroinvasive E coli (EIEC): NOT DETECTED
Vibrio cholerae: NOT DETECTED
Vibrio species: NOT DETECTED
Yersinia enterocolitica: NOT DETECTED

## 2015-07-31 LAB — C DIFFICILE QUICK SCREEN W PCR REFLEX
C DIFFICILE (CDIFF) TOXIN: NEGATIVE
C Diff antigen: POSITIVE — AB

## 2015-08-27 ENCOUNTER — Encounter: Payer: Self-pay | Admitting: *Deleted

## 2015-08-28 ENCOUNTER — Ambulatory Visit
Admission: RE | Admit: 2015-08-28 | Discharge: 2015-08-28 | Disposition: A | Discharge status: Home / Self Care | Payer: Medicare Other | Source: Ambulatory Visit | Attending: Unknown Physician Specialty | Admitting: Unknown Physician Specialty

## 2015-08-28 ENCOUNTER — Encounter
Admission: RE | Disposition: A | Discharge status: Home / Self Care | Payer: Self-pay | Source: Ambulatory Visit | Attending: Unknown Physician Specialty

## 2015-08-28 ENCOUNTER — Encounter: Payer: Self-pay | Admitting: *Deleted

## 2015-08-28 ENCOUNTER — Ambulatory Visit: Payer: Medicare Other | Admitting: Anesthesiology

## 2015-08-28 DIAGNOSIS — K529 Noninfective gastroenteritis and colitis, unspecified: Secondary | ICD-10-CM | POA: Diagnosis not present

## 2015-08-28 DIAGNOSIS — G4730 Sleep apnea, unspecified: Secondary | ICD-10-CM | POA: Insufficient documentation

## 2015-08-28 DIAGNOSIS — D124 Benign neoplasm of descending colon: Secondary | ICD-10-CM | POA: Diagnosis not present

## 2015-08-28 DIAGNOSIS — R1011 Right upper quadrant pain: Secondary | ICD-10-CM | POA: Diagnosis present

## 2015-08-28 DIAGNOSIS — I10 Essential (primary) hypertension: Secondary | ICD-10-CM | POA: Insufficient documentation

## 2015-08-28 DIAGNOSIS — K2970 Gastritis, unspecified, without bleeding: Secondary | ICD-10-CM | POA: Diagnosis not present

## 2015-08-28 DIAGNOSIS — K3189 Other diseases of stomach and duodenum: Secondary | ICD-10-CM | POA: Diagnosis not present

## 2015-08-28 DIAGNOSIS — D123 Benign neoplasm of transverse colon: Secondary | ICD-10-CM | POA: Diagnosis not present

## 2015-08-28 DIAGNOSIS — K210 Gastro-esophageal reflux disease with esophagitis: Secondary | ICD-10-CM | POA: Diagnosis not present

## 2015-08-28 DIAGNOSIS — M1990 Unspecified osteoarthritis, unspecified site: Secondary | ICD-10-CM | POA: Diagnosis not present

## 2015-08-28 DIAGNOSIS — R011 Cardiac murmur, unspecified: Secondary | ICD-10-CM | POA: Diagnosis not present

## 2015-08-28 DIAGNOSIS — I495 Sick sinus syndrome: Secondary | ICD-10-CM | POA: Insufficient documentation

## 2015-08-28 DIAGNOSIS — Z950 Presence of cardiac pacemaker: Secondary | ICD-10-CM | POA: Insufficient documentation

## 2015-08-28 DIAGNOSIS — M329 Systemic lupus erythematosus, unspecified: Secondary | ICD-10-CM | POA: Diagnosis not present

## 2015-08-28 DIAGNOSIS — F17210 Nicotine dependence, cigarettes, uncomplicated: Secondary | ICD-10-CM | POA: Diagnosis not present

## 2015-08-28 HISTORY — PX: ESOPHAGOGASTRODUODENOSCOPY (EGD) WITH PROPOFOL: SHX5813

## 2015-08-28 HISTORY — PX: COLONOSCOPY WITH PROPOFOL: SHX5780

## 2015-08-28 SURGERY — COLONOSCOPY WITH PROPOFOL
Anesthesia: General

## 2015-08-28 MED ORDER — PIPERACILLIN-TAZOBACTAM 3.375 G IVPB
3.3750 g | Freq: Once | INTRAVENOUS | Status: AC
  Administered 2015-08-28: 3.375 g via INTRAVENOUS
  Filled 2015-08-28: qty 50

## 2015-08-28 MED ORDER — SODIUM CHLORIDE 0.9 % IV SOLN: INTRAVENOUS | Status: DC

## 2015-08-28 MED ORDER — MIDAZOLAM HCL 2 MG/2ML IJ SOLN
INTRAMUSCULAR | Status: DC | PRN
  Administered 2015-08-28: 1 mg via INTRAVENOUS

## 2015-08-28 MED ORDER — LIDOCAINE HCL (CARDIAC) 20 MG/ML IV SOLN
INTRAVENOUS | Status: DC | PRN
  Administered 2015-08-28: 3 mg via INTRAVENOUS

## 2015-08-28 MED ORDER — PROPOFOL 500 MG/50ML IV EMUL
INTRAVENOUS | Status: DC | PRN
  Administered 2015-08-28: 100 ug/kg/min via INTRAVENOUS

## 2015-08-28 MED ORDER — EPHEDRINE SULFATE 50 MG/ML IJ SOLN
INTRAMUSCULAR | Status: DC | PRN
  Administered 2015-08-28: 10 mg via INTRAVENOUS
  Administered 2015-08-28: 5 mg via INTRAVENOUS
  Administered 2015-08-28: 10 mg via INTRAVENOUS

## 2015-08-28 MED ORDER — SODIUM CHLORIDE 0.9 % IV SOLN
INTRAVENOUS | Status: DC
  Administered 2015-08-28: 1000 mL via INTRAVENOUS

## 2015-08-28 MED ORDER — FENTANYL CITRATE (PF) 100 MCG/2ML IJ SOLN
INTRAMUSCULAR | Status: DC | PRN
  Administered 2015-08-28: 50 ug via INTRAVENOUS

## 2015-08-28 NOTE — Transfer of Care (Signed)
Immediate Anesthesia Transfer of Care Note  Patient: Patricia Christensen  Procedure(s) Performed: Procedure(s): COLONOSCOPY WITH PROPOFOL (N/A) ESOPHAGOGASTRODUODENOSCOPY (EGD) WITH PROPOFOL (N/A)  Patient Location: PACU  Anesthesia Type:General  Level of Consciousness: awake and sedated  Airway & Oxygen Therapy: Patient Spontanous Breathing and Patient connected to nasal cannula oxygen  Post-op Assessment: Report given to RN and Post -op Vital signs reviewed and stable  Post vital signs: Reviewed and stable  Last Vitals:  Filed Vitals:   08/28/15 1223  BP: 126/64  Pulse: 76  Temp: 36.5 C  Resp: 20    Last Pain:  Filed Vitals:   08/28/15 1225  PainSc: 10-Worst pain ever         Complications: No apparent anesthesia complications

## 2015-08-28 NOTE — Anesthesia Procedure Notes (Signed)
Performed by: Vaughan Sine Pre-anesthesia Checklist: Patient identified, Emergency Drugs available, Suction available, Patient being monitored and Timeout performed Patient Re-evaluated:Patient Re-evaluated prior to inductionOxygen Delivery Method: Nasal cannula Preoxygenation: Pre-oxygenation with 100% oxygen Intubation Type: IV induction Ventilation: Oral airway inserted - appropriate to patient size Airway Equipment and Method: Bite block Placement Confirmation: CO2 detector and positive ETCO2

## 2015-08-28 NOTE — Op Note (Signed)
[facility] Gastroenterology Patient Name: Patricia Christensen Procedure Date: 08/28/2015 1:17 PM MRN: [id number] Account #: [id number] Date of Birth: Oct 14, 1950 Admit Type: Outpatient Age: 65 Room: [facility] ENDO ROOM 1 Gender: Female Note Status: Finalized Procedure:            Upper GI endoscopy Indications:          Abdominal pain in the right upper quadrant, Heartburn Providers:            Manya Silvas, MD Referring MD:         No Local Md, MD (Referring MD) Medicines:            Propofol per Anesthesia Complications:        No immediate complications. Procedure:            Pre-Anesthesia Assessment:                       - After reviewing the risks and benefits, the patient                        was deemed in satisfactory condition to undergo the                        procedure.                       After obtaining informed consent, the endoscope was                        passed under direct vision. Throughout the procedure,                        the patient's blood pressure, pulse, and oxygen                        saturations were monitored continuously. The Endoscope                        was introduced through the mouth, and advanced to the                        second part of duodenum. The upper GI endoscopy was                        accomplished without difficulty. The patient tolerated                        the procedure well. Findings:      LA Grade A (one or more mucosal breaks less than 5 mm, not extending       between tops of 2 mucosal folds) esophagitis with no bleeding was found       40 cm from the incisors.      Diffuse and segmental mild inflammation characterized by erythema and       granularity was found in the gastric body and in the gastric antrum.       Biopsies were taken with a cold forceps for histology. Biopsies were       taken with a cold forceps for Helicobacter pylori testing.      The examined duodenum was  normal. Impression:           -  LA Grade A reflux esophagitis.                       - Gastritis. Biopsied.                       - Normal examined duodenum. Recommendation:       - Await pathology results. Do colonoscopy Manya Silvas, MD 08/28/2015 1:30:24 PM This report has been signed electronically. Number of Addenda: 0 Note Initiated On: 08/28/2015 1:17 PM      [facility]

## 2015-08-28 NOTE — Op Note (Signed)
[facility] Gastroenterology Patient Name: Kent Stuller Procedure Date: 08/28/2015 1:16 PM MRN: [id number] Account #: [id number] Date of Birth: [birth year] Admit Type: Outpatient Age: 65 Room: [facility] ENDO ROOM 1 Gender: Female Note Status: Finalized Procedure:            Colonoscopy Indications:          Abdominal pain in the right upper quadrant, Chronic                        diarrhea Providers:            Manya Silvas, MD Referring MD:         No Local Md, MD (Referring MD) Medicines:            Propofol per Anesthesia Complications:        No immediate complications. Procedure:            Pre-Anesthesia Assessment:                       - After reviewing the risks and benefits, the patient                        was deemed in satisfactory condition to undergo the                        procedure.                       After obtaining informed consent, the colonoscope was                        passed under direct vision. Throughout the procedure,                        the patient's blood pressure, pulse, and oxygen                        saturations were monitored continuously. The                        Colonoscope was introduced through the anus and                        advanced to the the cecum, identified by appendiceal                        orifice and ileocecal valve. The colonoscopy was                        performed without difficulty. The patient tolerated the                        procedure well. The quality of the bowel preparation                        was adequate to identify polyps. Findings:      A small polyp was found in the descending colon. The polyp was sessile.       The polyp was removed with a hot snare. Resection and retrieval were       complete.      A small polyp  was found in the hepatic flexure. The polyp was sessile.       The polyp was removed with a hot snare. Resection and retrieval were       complete.      A  diminutive polyp was found in the descending colon. The polyp was       sessile. The polyp was removed with a jumbo cold forceps. Resection and       retrieval were complete.      Localized moderate inflammation characterized by erythema and       granularity was found at the ileocecal valve. Biopsies were taken with a       cold forceps for histology.      There was splotches of previous [place] ink tattoo marks.      Scattered splotches of inflammation seen throughout the colon and were       biopsied in descending, rectum, ascending, transverse,sigmoid colon. Impression:           - One small polyp in the descending colon, removed with                        a hot snare. Resected and retrieved.                       - One small polyp at the hepatic flexure, removed with                        a hot snare. Resected and retrieved.                       - One diminutive polyp in the descending colon, removed                        with a jumbo cold forceps. Resected and retrieved.                       - Localized moderate inflammation was found at the                        ileocecal valve secondary to colitis. Biopsied. Recommendation:       - Await pathology results. Consider antibiotics and                        steroids. Manya Silvas, MD 08/28/2015 2:16:06 PM This report has been signed electronically. Number of Addenda: 0 Note Initiated On: 08/28/2015 1:16 PM Scope Withdrawal Time: 0 hours 17 minutes 46 seconds  Total Procedure Duration: 0 hours 35 minutes 39 seconds       [facility]

## 2015-08-28 NOTE — Anesthesia Postprocedure Evaluation (Signed)
Anesthesia Post Note  Patient: Patricia Christensen  Procedure(s) Performed: Procedure(s) (LRB): COLONOSCOPY WITH PROPOFOL (N/A) ESOPHAGOGASTRODUODENOSCOPY (EGD) WITH PROPOFOL (N/A)  Patient location during evaluation: PACU Anesthesia Type: General Level of consciousness: awake and alert and oriented Pain management: pain level controlled Vital Signs Assessment: post-procedure vital signs reviewed and stable Respiratory status: spontaneous breathing Cardiovascular status: blood pressure returned to baseline Anesthetic complications: no    Last Vitals:  Filed Vitals:   08/28/15 1430 08/28/15 1440  BP: 116/67 119/60  Pulse: 86 71  Temp:    Resp: 16 26    Last Pain:  Filed Vitals:   08/28/15 1449  PainSc: 10-Worst pain ever                 Ashur Glatfelter

## 2015-08-28 NOTE — H&P (Signed)
Primary Care Physician:  No primary care provider on file. Primary Gastroenterologist:  Dr. Vira Agar  Pre-Procedure History & Physical: HPI:  Patricia Christensen is a 65 y.o. female is here for an endoscopy and colonoscopy.   Past Medical History  Diagnosis Date  . Pacemaker     4- at [facility]  . Lupus (systemic lupus erythematosus) ([place])   . Degenerative joint disease   . Sick sinus syndrome ([place])   . Hypertension   . Heart murmur   . Headache(784.0)     Hx .  Marland Kitchen H/O bladder infections   . GERD (gastroesophageal reflux disease)   . Hyperlipemia   . Numbness and tingling   . Cataracts, bilateral   . Rash     from lupus  . Sleep apnea     patient states she does not haver sleep apnea    Past Surgical History  Procedure Laterality Date  . Kidney surgery      Right  . Abdominal hysterectomy    . Cholecystectomy    . Knee arthroplasty      2  each knee- last one 2011  . Cardiac catheterization      [facility]  . Anterior fusion cervical spine    . Posterior fusion cervical spine    . Lumbar laminectomy/decompression microdiscectomy  01/21/2012    Procedure: LUMBAR LAMINECTOMY/DECOMPRESSION MICRODISCECTOMY 1 LEVEL;  Surgeon: Winfield Cunas, MD;  Location: [facility];  Service: Neurosurgery;  Laterality: Left;  Left lumbar three-four laminectomy and diskectomy    Prior to Admission medications   Medication Sig Start Date End Date Taking? Authorizing Provider  atenolol (TENORMIN) 50 MG tablet Take 50 mg by mouth daily.   Yes Historical Provider, MD  atorvastatin (LIPITOR) 10 MG tablet Take 10 mg by mouth daily.   Yes Historical Provider, MD  estrogens, conjugated, (PREMARIN) 0.3 MG tablet Take 0.3 mg by mouth daily. Take daily for 21 days then do not take for 7 days.   Yes Historical Provider, MD  gabapentin (NEURONTIN) 300 MG capsule Take 300 mg by mouth 3 (three) times daily.   Yes Historical Provider, MD  hydroxychloroquine (PLAQUENIL) 200 MG tablet Take 200 mg by mouth daily.   Yes  Historical Provider, MD  lisinopril (PRINIVIL,ZESTRIL) 40 MG tablet Take 40 mg by mouth daily.   Yes Historical Provider, MD  nitroGLYCERIN (NITROSTAT) 0.4 MG SL tablet Place 0.4 mg under the tongue every 5 (five) minutes as needed. For chest pain   Yes Historical Provider, MD  omeprazole (PRILOSEC) 20 MG capsule Take 20 mg by mouth daily.   Yes Historical Provider, MD  oxyCODONE-acetaminophen (TYLOX) 5-500 MG per capsule Take 1 capsule by mouth every 6 (six) hours as needed. For pain   Yes Historical Provider, MD    Allergies as of 08/12/2015 - Review Complete 01/21/2012  Allergen Reaction Noted  . Aspirin Anaphylaxis 01/17/2012  . Prednisone Anaphylaxis and Other (See Comments) 01/17/2012  . Dilaudid [hydromorphone hcl] Other (See Comments) 01/17/2012  . Codeine Nausea And Vomiting and Rash 01/17/2012  . Ivp dye [iodinated diagnostic agents] Rash 01/17/2012  . Lodine [etodolac] Rash 01/17/2012    History reviewed. No pertinent family history.  Social History   Social History  . Marital Status: Divorced    Spouse Name: N/A  . Number of Children: N/A  . Years of Education: N/A   Occupational History  . Not on file.   Social History Main Topics  . Smoking status: Current Some Day Smoker --  1.00 packs/day for 30 years  . Smokeless tobacco: Not on file  . Alcohol Use: No  . Drug Use: No  . Sexual Activity: Not on file   Other Topics Concern  . Not on file   Social History Narrative    Review of Systems: See HPI, otherwise negative ROS  Physical Exam: BP 126/64 mmHg  Pulse 76  Temp(Src) 97.7 F (36.5 C) (Tympanic)  Resp 20  Ht 5\' 6"  (1.676 m)  Wt 103.874 kg (229 lb)  BMI 36.98 kg/m2  SpO2 98% General:   Alert,  pleasant and cooperative in NAD Head:  Normocephalic and atraumatic. Neck:  Supple; no masses or thyromegaly. Lungs:  Clear throughout to auscultation.    Heart:  Regular rate and rhythm. Abdomen:  Soft, nontender and nondistended. Normal bowel sounds,  without guarding, and without rebound.   Neurologic:  Alert and  oriented x4;  grossly normal neurologically.  Impression/Plan: Patricia Christensen is here for an endoscopy and colonoscopy to be performed for [facility] colon polyps, chronic diarrhea, GERD  Risks, benefits, limitations, and alternatives regarding  endoscopy and colonoscopy have been reviewed with the patient.  Questions have been answered.  All parties agreeable.   Gaylyn Cheers, MD  08/28/2015, 1:14 PM

## 2015-08-28 NOTE — Anesthesia Preprocedure Evaluation (Addendum)
Anesthesia Evaluation  Patient identified by MRN, date of birth, ID band Patient awake    Reviewed: Allergy & Precautions, NPO status , Patient's Chart, lab work & pertinent test results, reviewed documented beta blocker date and time   History of Anesthesia Complications (+) MALIGNANT HYPERTHERMIA  Airway Mallampati: IV  TM Distance: <3 FB Neck ROM: Full    Dental  (+) Upper Dentures, Chipped   Pulmonary sleep apnea , Current Smoker,    Pulmonary exam normal        Cardiovascular hypertension, Pt. on medications and Pt. on home beta blockers + pacemaker + Valvular Problems/Murmurs   paced   Neuro/Psych  Headaches, negative psych ROS   GI/Hepatic Neg liver ROS, GERD  Medicated and Controlled,  Endo/Other  negative endocrine ROS  Renal/GU negative Renal ROS  negative genitourinary   Musculoskeletal  (+) Arthritis , Osteoarthritis,    Abdominal Normal abdominal exam  (+)   Peds negative pediatric ROS (+)  Hematology negative hematology ROS (+)   Anesthesia Other Findings Sick sinus syndrome SLE Sleep apnea  Reproductive/Obstetrics                           Anesthesia Physical Anesthesia Plan  ASA: III  Anesthesia Plan: General   Post-op Pain Management:    Induction: Intravenous  Airway Management Planned: Nasal Cannula  Additional Equipment:   Intra-op Plan:   Post-operative Plan:   Informed Consent: I have reviewed the patients History and Physical, chart, labs and discussed the procedure including the risks, benefits and alternatives for the proposed anesthesia with the patient or authorized representative who has indicated his/her understanding and acceptance.   Dental advisory given  Plan Discussed with: CRNA and Surgeon  Anesthesia Plan Comments:                                          Anesthesia Evaluation  Patient identified by MRN, date of birth, ID  band Patient awake    Reviewed: Allergy & Precautions, H&P , NPO status , Patient's Chart, lab work & pertinent test results  Airway Mallampati: II  Neck ROM: full    Dental  (+) Dental Advisory Given   Pulmonary sleep apnea ,          Cardiovascular hypertension, Pt. on home beta blockers + dysrhythmias (sick sinus syndrome) + pacemaker     Neuro/Psych    GI/Hepatic GERD-  ,  Endo/Other  Morbid obesityobese  Renal/GU      Musculoskeletal   Abdominal   Peds  Hematology   Anesthesia Other Findings   Reproductive/Obstetrics                          Anesthesia Physical Anesthesia Plan  ASA: III  Anesthesia Plan: General   Post-op Pain Management:    Induction: Intravenous  Airway Management Planned: Oral ETT  Additional Equipment:   Intra-op Plan:   Post-operative Plan: Extubation in OR  Informed Consent: I have reviewed the patients History and Physical, chart, labs and discussed the procedure including the risks, benefits and alternatives for the proposed anesthesia with the patient or authorized representative who has indicated his/her understanding and acceptance.   Dental advisory given  Plan Discussed with: Anesthesiologist and Surgeon  Anesthesia Plan Comments:         Anesthesia  Quick Evaluation  Anesthesia Quick Evaluation

## 2015-08-29 ENCOUNTER — Encounter: Payer: Self-pay | Admitting: Unknown Physician Specialty

## 2015-08-29 LAB — SURGICAL PATHOLOGY

## 2016-05-12 ENCOUNTER — Ambulatory Visit (INDEPENDENT_AMBULATORY_CARE_PROVIDER_SITE_OTHER): Payer: Self-pay | Admitting: Vascular Surgery

## 2016-05-12 ENCOUNTER — Encounter (INDEPENDENT_AMBULATORY_CARE_PROVIDER_SITE_OTHER): Payer: Self-pay

## 2016-11-02 ENCOUNTER — Ambulatory Visit (INDEPENDENT_AMBULATORY_CARE_PROVIDER_SITE_OTHER): Payer: Self-pay | Admitting: Vascular Surgery

## 2016-11-02 ENCOUNTER — Encounter (INDEPENDENT_AMBULATORY_CARE_PROVIDER_SITE_OTHER): Payer: Self-pay

## 2018-07-14 DEATH — deceased
